# Patient Record
Sex: Male | Born: 1960 | Race: Black or African American | Hispanic: No | Marital: Single | Smoking: Current some day smoker
Health system: Southern US, Community
[De-identification: ages and names within clinical notes are randomized; demographics above are authoritative.]

## PROBLEM LIST (undated history)

## (undated) DIAGNOSIS — I10 Essential (primary) hypertension: Secondary | ICD-10-CM

## (undated) HISTORY — PX: CHOLECYSTECTOMY: SHX55

## (undated) HISTORY — PX: HERNIA REPAIR: SHX51

## (undated) HISTORY — DX: Essential (primary) hypertension: I10

## (undated) HISTORY — PX: JOINT REPLACEMENT: SHX530

---

## 2017-04-26 ENCOUNTER — Encounter (HOSPITAL_COMMUNITY): Payer: Self-pay | Admitting: Emergency Medicine

## 2017-04-26 ENCOUNTER — Ambulatory Visit (HOSPITAL_COMMUNITY)
Admission: EM | Admit: 2017-04-26 | Discharge: 2017-04-26 | Disposition: A | Discharge status: Home / Self Care | Payer: Managed Care, Other (non HMO) | Attending: Internal Medicine | Admitting: Internal Medicine

## 2017-04-26 DIAGNOSIS — Z91013 Allergy to seafood: Secondary | ICD-10-CM | POA: Insufficient documentation

## 2017-04-26 DIAGNOSIS — A09 Infectious gastroenteritis and colitis, unspecified: Secondary | ICD-10-CM

## 2017-04-26 DIAGNOSIS — R197 Diarrhea, unspecified: Secondary | ICD-10-CM | POA: Diagnosis present

## 2017-04-26 DIAGNOSIS — R109 Unspecified abdominal pain: Secondary | ICD-10-CM | POA: Diagnosis present

## 2017-04-26 DIAGNOSIS — F17210 Nicotine dependence, cigarettes, uncomplicated: Secondary | ICD-10-CM | POA: Insufficient documentation

## 2017-04-26 MED ORDER — CIPROFLOXACIN HCL 500 MG PO TABS: 500.0000 mg | ORAL_TABLET | Freq: Two times a day (BID) | ORAL | 0 refills | Status: AC

## 2017-04-26 MED ORDER — DIPHENOXYLATE-ATROPINE 2.5-0.025 MG PO TABS: 2.0000 | ORAL_TABLET | Freq: Four times a day (QID) | ORAL | 0 refills | Status: DC | PRN

## 2017-04-26 NOTE — ED Triage Notes (Signed)
[facility] with a complaint of abdominal pain and diarrhea x 2 weeks.

## 2017-04-26 NOTE — ED Provider Notes (Signed)
[facility]    CSN: [id number] Arrival date & time: 04/26/17  1628     History   Chief Complaint Chief Complaint  Patient presents with  . Abdominal Pain    HPI Bobby Browning is a 56 y.o. male.  He started 2 weeks ago with some chills after eating fast food and by the next morning was having watery diarrhea. No nausea/vomiting. Took some Imodium and some Pepto-Bismol, stool consistency firmed up for a while, however he has stopped taking these and now has watery diarrhea occurring approximately hourly again. Crampy left lower quadrant discomfort after eating, improves after having a bowel movement. No fever. Little bit of blood on the toilet paper 1. Like initial stools were dark, but unclear timeline for starting Pepto-Bismol and the relationship to the darkness. Not lightheaded. Does feel tired.   No new skin issues, no new joint pains or achiness. He is not sleeping well. Works in Personnel officer, needs a note for work.   HPI  History reviewed. No pertinent past medical history.   Past Surgical History:  Procedure Laterality Date  . HERNIA REPAIR    . JOINT REPLACEMENT         Home Medications   Takes no meds regularly  Family History History reviewed. No pertinent family history.  Social History Social History  Substance Use Topics  . Smoking status: Current Some Day Smoker    Types: Cigarettes  . Smokeless tobacco: Never Used  . Alcohol use Yes     Allergies   Shellfish allergy   Review of Systems Review of Systems  All other systems reviewed and are negative.    Physical Exam Triage Vital Signs ED Triage Vitals  Enc Vitals Group     BP 04/26/17 1658 (!) 152/94     Pulse Rate 04/26/17 1658 72     Resp 04/26/17 1658 18     Temp 04/26/17 1658 98.6 F (37 C)     Temp Source 04/26/17 1658 Oral     SpO2 04/26/17 1658 97 %     Weight --      Height --      Pain Score 04/26/17 1655 5     Pain Loc --    Updated Vital Signs BP (!)  152/94 (BP Location: Right Arm)   Pulse 72   Temp 98.6 F (37 C) (Oral)   Resp 18   SpO2 97%   Physical Exam  Constitutional: He is oriented to [place], place, and time. No distress.  Alert, nicely groomed  HENT:  Head: Atraumatic.  Eyes:  Conjugate gaze, no eye redness/drainage  Neck: Neck supple.  Cardiovascular: Normal rate and regular rhythm.   Pulmonary/Chest: No respiratory distress. He has no wheezes. He has no rales.  Lungs clear, symmetric breath sounds  Abdominal: Soft. He exhibits no distension. There is no tenderness. There is no rebound and no guarding.  Musculoskeletal: Normal range of motion.  Neurological: He is alert and oriented to [place], place, and time.  Skin: Skin is warm and dry.  No cyanosis  Nursing note and vitals reviewed.    UC Treatments / Results  Labs Stool GI panel obtained during visit; result pending  Procedures Procedures (including critical care time)   Final Clinical Impressions(s) / UC Diagnoses   Final diagnoses:  Acute infective gastroenteritis   Stool test for common causes of persistent diarrhea was done today; the urgent care will contact you if additional treatment is needed.  Note for work.  Prescription for cipro (antibiotic) was sent to the pharmacy, and a prescription for lomotil (for diarrhea) was printed.  Push fluids.  Rest.  Careful hand washing after using the bathroom.  New Prescriptions Discharge Medication List as of 04/26/2017  7:01 PM    START taking these medications   Details  ciprofloxacin (CIPRO) 500 MG tablet Take 1 tablet (500 mg total) by mouth 2 (two) times daily., Starting Sun 04/26/2017, Until Fri 05/01/2017, Normal    diphenoxylate-atropine (LOMOTIL) 2.5-0.025 MG tablet Take 2 tablets by mouth 4 (four) times daily as needed for diarrhea or loose stools., Starting Sun 04/26/2017, Print         Eustace MooreMurray, Eliyah Mcshea W, MD 04/27/17 1012

## 2017-04-26 NOTE — Discharge Instructions (Addendum)
Stool test for common causes of persistent diarrhea was done today; the urgent care will contact you if additional treatment is needed.  Note for work.  Prescription for cipro (antibiotic) was sent to the pharmacy, and a prescription for lomotil (for diarrhea) was printed.  Push fluids.  Rest.  Careful hand washing after using the bathroom.

## 2017-04-27 ENCOUNTER — Telehealth (HOSPITAL_COMMUNITY): Payer: Self-pay | Admitting: Internal Medicine

## 2017-04-27 LAB — GASTROINTESTINAL PANEL BY PCR, STOOL (REPLACES STOOL CULTURE)

## 2017-04-27 MED ORDER — TINIDAZOLE 500 MG PO TABS: 2.0000 g | ORAL_TABLET | Freq: Once | ORAL | 0 refills | Status: AC

## 2017-04-27 NOTE — Telephone Encounter (Addendum)
Please let patient and health department know that stool test was positive for giardia.   Prescription for a dose of tinidazole was sent to the pharmacy of record, [facility] [address] [facility] [address].   Anticipate marked improvement in diarrhea over the next 5-7 days.  Recheck for further evaluation if symptoms are not improving.   Continue careful hand washing after every trip to the bathroom and before handling food.  LM

## 2019-06-07 ENCOUNTER — Other Ambulatory Visit: Payer: Self-pay | Admitting: *Deleted

## 2019-06-07 DIAGNOSIS — Z20822 Contact with and (suspected) exposure to covid-19: Secondary | ICD-10-CM

## 2019-06-12 LAB — NOVEL CORONAVIRUS, NAA: SARS-CoV-2, NAA: NOT DETECTED

## 2020-01-26 ENCOUNTER — Ambulatory Visit: Payer: Managed Care, Other (non HMO) | Attending: Internal Medicine

## 2020-01-26 DIAGNOSIS — Z23 Encounter for immunization: Secondary | ICD-10-CM

## 2020-01-26 NOTE — Progress Notes (Signed)
   Covid-19 Vaccination Clinic  Name:  Devanta Daniel    MRN: [id number] DOB: [birth year]  01/26/2020  Mr. Bos was observed post Covid-19 immunization for 15 minutes without incident. He was provided with Vaccine Information Sheet and instruction to access the V-Safe system.   Mr. Quezada was instructed to call 911 with any severe reactions post vaccine: Marland Kitchen Difficulty breathing  . Swelling of face and throat  . A fast heartbeat  . A bad rash all over body  . Dizziness and weakness   Immunizations Administered    Name Date Dose VIS Date Route   Moderna COVID-19 Vaccine 01/26/2020  5:00 PM 0.5 mL 10/25/2019 Intramuscular   Manufacturer: Moderna   Lot: [id number]   NDC: 27741-287-86

## 2020-02-28 ENCOUNTER — Ambulatory Visit: Payer: Managed Care, Other (non HMO) | Attending: Family

## 2020-02-28 DIAGNOSIS — Z23 Encounter for immunization: Secondary | ICD-10-CM

## 2020-02-28 NOTE — Progress Notes (Signed)
   Covid-19 Vaccination Clinic  Name:  Bobby Browning    MRN: [id number] DOB: 1961-01-01  02/28/2020  Mr. [name] was observed post Covid-19 immunization for 15 minutes without incident. He was provided with Vaccine Information Sheet and instruction to access the V-Safe system.   Mr. [name] was instructed to call 911 with any severe reactions post vaccine: Marland Kitchen Difficulty breathing  . Swelling of face and throat  . A fast heartbeat  . A bad rash all over body  . Dizziness and weakness   Immunizations Administered    Name Date Dose VIS Date Route   Moderna COVID-19 Vaccine 02/28/2020  2:17 PM 0.5 mL 10/25/2019 Intramuscular   Manufacturer: Moderna   Lot: [id number]   NDC: 09311-216-24

## 2020-05-15 ENCOUNTER — Ambulatory Visit (HOSPITAL_COMMUNITY)
Admission: EM | Admit: 2020-05-15 | Discharge: 2020-05-15 | Disposition: A | Discharge status: Home / Self Care | Payer: BC Managed Care – PPO | Attending: Physician Assistant | Admitting: Physician Assistant

## 2020-05-15 ENCOUNTER — Encounter (HOSPITAL_COMMUNITY): Payer: Self-pay

## 2020-05-15 ENCOUNTER — Other Ambulatory Visit: Payer: Self-pay

## 2020-05-15 DIAGNOSIS — T07XXXA Unspecified multiple injuries, initial encounter: Secondary | ICD-10-CM

## 2020-05-15 DIAGNOSIS — I10 Essential (primary) hypertension: Secondary | ICD-10-CM
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
## 2020-05-15 NOTE — Discharge Instructions (Addendum)
Return if any problems.

## 2020-09-20 ENCOUNTER — Ambulatory Visit: Payer: BC Managed Care – PPO | Attending: Internal Medicine

## 2020-09-20 DIAGNOSIS — Z23 Encounter for immunization: Secondary | ICD-10-CM

## 2020-09-20 NOTE — Progress Notes (Signed)
   Covid-19 Vaccination Clinic  Name:  Bobby Browning    MRN: [id number] DOB: Oct 28, 1961  09/20/2020  [name] was observed post Covid-19 immunization for 30 minutes based on pre-vaccination screening without incident. He was provided with Vaccine Information Sheet and instruction to access the V-Safe system.   [name] was instructed to call 911 with any severe reactions post vaccine: Marland Kitchen Difficulty breathing  . Swelling of face and throat  . A fast heartbeat  . A bad rash all over body  . Dizziness and weakness

## 2021-01-11 ENCOUNTER — Ambulatory Visit (INDEPENDENT_AMBULATORY_CARE_PROVIDER_SITE_OTHER): Payer: BC Managed Care – PPO

## 2021-01-11 ENCOUNTER — Encounter (HOSPITAL_COMMUNITY): Payer: Self-pay

## 2021-01-11 ENCOUNTER — Other Ambulatory Visit: Payer: Self-pay

## 2021-01-11 ENCOUNTER — Ambulatory Visit (HOSPITAL_COMMUNITY)
Admission: EM | Admit: 2021-01-11 | Discharge: 2021-01-11 | Disposition: A | Discharge status: Home / Self Care | Payer: BC Managed Care – PPO

## 2021-01-11 DIAGNOSIS — R0602 Shortness of breath: Secondary | ICD-10-CM | POA: Diagnosis not present

## 2021-01-11 DIAGNOSIS — J189 Pneumonia, unspecified organism: Secondary | ICD-10-CM | POA: Diagnosis not present

## 2021-01-11 DIAGNOSIS — R059 Cough, unspecified: Secondary | ICD-10-CM | POA: Diagnosis not present

## 2021-01-11 MED ORDER — BENZONATATE 100 MG PO CAPS: 100.0000 mg | ORAL_CAPSULE | Freq: Three times a day (TID) | ORAL | 0 refills | Status: DC | PRN

## 2021-01-11 MED ORDER — AZITHROMYCIN 250 MG PO TABS: ORAL_TABLET | ORAL | 0 refills | Status: DC

## 2021-01-11 MED ORDER — ACETAMINOPHEN 325 MG PO TABS
ORAL_TABLET | ORAL | Status: AC
Start: 2021-01-11 — End: ?
  Filled 2021-01-11: qty 2

## 2021-01-11 MED ORDER — AMOXICILLIN 500 MG PO CAPS: 1000.0000 mg | ORAL_CAPSULE | Freq: Three times a day (TID) | ORAL | 0 refills | Status: DC

## 2021-01-11 MED ORDER — ACETAMINOPHEN 325 MG PO TABS
650.0000 mg | ORAL_TABLET | Freq: Once | ORAL | Status: AC
  Administered 2021-01-11: 650 mg via ORAL

## 2021-01-11 MED ORDER — PROMETHAZINE-DM 6.25-15 MG/5ML PO SYRP: 5.0000 mL | ORAL_SOLUTION | Freq: Every evening | ORAL | 0 refills | Status: DC | PRN

## 2021-01-11 NOTE — ED Provider Notes (Signed)
[facility]   MRN: [id number] DOB: [birth year]  Subjective:   Bobby Browning is a 60 y.o. male presenting for 2 week history of persistent productive cough, nasal congestion, wheezing, chest congestion. Has a history of childhood asthma. Tested negative for COVID 19 twice already. He is vaccinated, has had his booster. Smokes very little.   No current facility-administered medications for this encounter.  Current Outpatient Medications:  .  dextromethorphan-guaiFENesin (MUCINEX DM) 30-600 MG 12hr tablet, Take 1 tablet by mouth 2 (two) times daily., Disp: , Rfl:  .  diphenoxylate-atropine (LOMOTIL) 2.5-0.025 MG tablet, Take 2 tablets by mouth 4 (four) times daily as needed for diarrhea or loose stools., Disp: 30 tablet, Rfl: 0   Allergies  Allergen Reactions  . Shellfish Allergy     History reviewed. No pertinent past medical history.   Past Surgical History:  Procedure Laterality Date  . HERNIA REPAIR    . JOINT REPLACEMENT      History reviewed. No pertinent family history.  Social History   Tobacco Use  . Smoking status: Current Some Day Smoker    Types: Cigarettes  . Smokeless tobacco: Never Used  Vaping Use  . Vaping Use: Never used  Substance Use Topics  . Alcohol use: Yes  . Drug use: Yes    Types: Marijuana    ROS   Objective:   Vitals: BP (!) 149/91 (BP Location: Right Arm)   Pulse 74   Temp (!) 100.9 F (38.3 C) (Oral)   Resp 20   SpO2 97%   Physical Exam Constitutional:      General: He is not in acute distress.    Appearance: Normal appearance. He is well-developed. He is not ill-appearing, toxic-appearing or diaphoretic.  HENT:     Head: Normocephalic and atraumatic.     Right Ear: External ear normal.     Left Ear: External ear normal.     Nose: Nose normal.     Mouth/Throat:     Mouth: Mucous membranes are moist.     Pharynx: Oropharynx is clear.  Eyes:     General: No scleral icterus.    Extraocular Movements:  Extraocular movements intact.     Pupils: Pupils are equal, round, and reactive to light.  Cardiovascular:     Rate and Rhythm: Normal rate and regular rhythm.     Heart sounds: Normal heart sounds. No murmur heard. No friction rub. No gallop.   Pulmonary:     Effort: Pulmonary effort is normal. No respiratory distress.     Breath sounds: No stridor. Examination of the right-upper field reveals rales. Examination of the right-middle field reveals rales. Examination of the right-lower field reveals rales. Rales present. No wheezing or rhonchi.  Neurological:     Mental Status: He is alert and oriented to [place], place, and time.  Psychiatric:        Mood and Affect: Mood normal.        Behavior: Behavior normal.        Thought Content: Thought content normal.     DG Chest 2 View  Result Date: 01/11/2021 CLINICAL DATA:  Cough, shortness of breath. EXAM: CHEST - 2 VIEW COMPARISON:  None. FINDINGS: The heart size and mediastinal contours are within normal limits. No pneumothorax or pleural effusion is noted. Left lung is clear. Right lower lobe airspace opacity is noted consistent with pneumonia. The visualized skeletal structures are unremarkable. IMPRESSION: Right lower lobe pneumonia. Followup PA and lateral chest X-ray  is recommended in 3-4 weeks following trial of antibiotic therapy to ensure resolution and exclude underlying malignancy. Electronically Signed   By: Lupita Raider M.D.   On: 01/11/2021 16:38   Assessment and Plan :   PDMP not reviewed this encounter.  1. Community acquired pneumonia of right lower lobe of lung     As per up-to-date, will start azithromycin, amoxicillin.  Use supportive care otherwise.  Recommend recheck and repeat chest x-ray in 3 to 4 weeks. Counseled patient on potential for adverse effects with medications prescribed/recommended today, ER and return-to-clinic precautions discussed, patient verbalized understanding.    Wallis Bamberg, [place] 01/11/21  1707

## 2021-01-11 NOTE — Discharge Instructions (Signed)
We will manage this as a pneumonia with amoxicillin and azithromycin.  You can use the cough suppression medications I prescribed as well.  Please come back to our clinic in 3 to 4 weeks for repeat chest x-ray.  For sore throat or cough try using a honey-based tea. Use 3 teaspoons of honey with juice squeezed from half lemon. Place shaved pieces of ginger into 1/2-1 cup of water and warm over stove top. Then mix the ingredients and repeat every 4 hours as needed. Please take ibuprofen 600mg  every 6 hours with food alternating with OR taken together with Tylenol 500mg -650mg  every 6 hours for throat pain, fevers, aches and pains. Hydrate very well with at least 2 liters of water. Eat light meals such as soups (chicken and noodles, vegetable, chicken and wild rice).  Do not eat foods that you are allergic to.  Taking an antihistamine like Zyrtec, Allegra or Claritin can help against postnasal drainage, sinus congestion.

## 2021-01-11 NOTE — ED Triage Notes (Signed)
Pt parents with cough, nasal congestion, chest congestion and wheezing x 2 weeks. Mucinexgives some relief. Denies chest pain, SOB, fever.

## 2021-01-12 ENCOUNTER — Emergency Department (HOSPITAL_COMMUNITY)
Admission: EM | Admit: 2021-01-12 | Discharge: 2021-01-12 | Disposition: A | Discharge status: Home / Self Care | Payer: BC Managed Care – PPO | Attending: Emergency Medicine | Admitting: Emergency Medicine

## 2021-01-12 ENCOUNTER — Other Ambulatory Visit: Payer: Self-pay

## 2021-01-12 ENCOUNTER — Encounter (HOSPITAL_COMMUNITY): Payer: Self-pay

## 2021-01-12 DIAGNOSIS — Z5321 Procedure and treatment not carried out due to patient leaving prior to being seen by health care provider: Secondary | ICD-10-CM | POA: Insufficient documentation

## 2021-01-12 DIAGNOSIS — J189 Pneumonia, unspecified organism: Secondary | ICD-10-CM | POA: Diagnosis not present

## 2021-01-12 NOTE — ED Triage Notes (Signed)
Patient seen at [facility] yesterday and diagnosed with pneumonia and started on antibiotics yesterday. Here because reports ongoing hemoptysis and only had antibiotics x 1 day
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
## 2021-01-13 ENCOUNTER — Other Ambulatory Visit: Payer: Self-pay

## 2021-01-13 ENCOUNTER — Emergency Department (HOSPITAL_COMMUNITY): Payer: BC Managed Care – PPO

## 2021-01-13 ENCOUNTER — Encounter (HOSPITAL_COMMUNITY): Payer: Self-pay

## 2021-01-13 ENCOUNTER — Emergency Department (HOSPITAL_COMMUNITY)
Admission: EM | Admit: 2021-01-13 | Discharge: 2021-01-13 | Disposition: A | Discharge status: Home / Self Care | Payer: BC Managed Care – PPO | Attending: Emergency Medicine | Admitting: Emergency Medicine

## 2021-01-13 DIAGNOSIS — J181 Lobar pneumonia, unspecified organism: Secondary | ICD-10-CM | POA: Diagnosis not present

## 2021-01-13 DIAGNOSIS — F17210 Nicotine dependence, cigarettes, uncomplicated: Secondary | ICD-10-CM | POA: Diagnosis not present

## 2021-01-13 DIAGNOSIS — Z96698 Presence of other orthopedic joint implants: Secondary | ICD-10-CM | POA: Diagnosis not present

## 2021-01-13 DIAGNOSIS — J189 Pneumonia, unspecified organism: Secondary | ICD-10-CM

## 2021-01-13 DIAGNOSIS — R042 Hemoptysis: Secondary | ICD-10-CM | POA: Insufficient documentation

## 2021-01-13 DIAGNOSIS — R059 Cough, unspecified: Secondary | ICD-10-CM | POA: Diagnosis present

## 2021-01-13 NOTE — Discharge Instructions (Addendum)
Continue taking antibiotics as prescribed. Make sure staying well-hydrated with water. Follow-up for repeat x-ray in 3 to 4 weeks. Return to the emergency room if you develop pain, increase difficulty breathing, if you are coughing up blood clots, with any new, worsening, or concerning symptoms

## 2021-01-13 NOTE — ED Triage Notes (Signed)
Pt presents with c/o hemoptysis. Pt was diagnosed with pneumonia 2 days ago and has been on abx since then. Pt reports that he had been coughing up blood when he was diagnosed but when he saw the [place] papers saying to come back for coughing up blood with a dx of pneumonia, he became concerned that there was more going on. Pt went to [facility] yesterday for same.

## 2021-01-13 NOTE — ED Provider Notes (Signed)
[facility]-EMERGENCY DEPT Provider Note   CSN: [id number] Arrival date & time: 01/13/21  0741     History Chief Complaint  Patient presents with  . Hemoptysis    Bobby Browning is a 60 y.o. male presenting for evaluation of hemoptysis.   Pt states he had 2 wks of cough and sob. He was seen at UC 2 days ago, dx with pna and started on abx. Pt states as soon as he got home, he had hemoptysis, which he describes as blood streaked sputum. He states after 2 days of abx, he is feeling much better, cough and SOB improved.  He denies fevers, chills, chest pain, chest tightness, nausea, vomiting abdominal pain.  He denies recent travel, surgeries, immobilization, history of cancer, history previous DVT/PE, or hormone use.  He is not on blood thinners.  He denies leg pain or swelling.  He is here because the hemoptysis is new, in the AVS he was discharged with said to be reevaluated if he develops hemoptysis. The amt of blood has not changed or worsened.   HPI     History reviewed. No pertinent past medical history.  There are no problems to display for this patient.   Past Surgical History:  Procedure Laterality Date  . HERNIA REPAIR    . JOINT REPLACEMENT         History reviewed. No pertinent family history.  Social History   Tobacco Use  . Smoking status: Current Some Day Smoker    Types: Cigarettes  . Smokeless tobacco: Never Used  Vaping Use  . Vaping Use: Never used  Substance Use Topics  . Alcohol use: Yes  . Drug use: Yes    Types: Marijuana    Home Medications Prior to Admission medications   Medication Sig Start Date End Date Taking? Authorizing Provider  amoxicillin (AMOXIL) 500 MG capsule Take 2 capsules (1,000 mg total) by mouth 3 (three) times daily. 01/11/21  Yes Wallis Bamberg, PA-C  azithromycin (ZITHROMAX) 250 MG tablet Start with 2 tablets today, then 1 daily thereafter. Patient taking differently: Take 250 mg by mouth See admin  instructions. Start with 2 tablets today, then 1 daily thereafter. 01/11/21  Yes Wallis Bamberg, PA-C  benzonatate (TESSALON) 100 MG capsule Take 1-2 capsules (100-200 mg total) by mouth 3 (three) times daily as needed for cough. 01/11/21  Yes Wallis Bamberg, PA-C  dextromethorphan-guaiFENesin [facility] DM) 30-600 MG 12hr tablet Take 1 tablet by mouth 2 (two) times daily as needed for cough.   Yes [provider]  ibuprofen (ADVIL) 200 MG tablet Take 200 mg by mouth every 6 (six) hours as needed for mild pain.   Yes [provider]  promethazine-dextromethorphan (PROMETHAZINE-DM) 6.25-15 MG/5ML syrup Take 5 mLs by mouth at bedtime as needed for cough. 01/11/21  Yes Wallis Bamberg, PA-C  diphenoxylate-atropine (LOMOTIL) 2.5-0.025 MG tablet Take 2 tablets by mouth 4 (four) times daily as needed for diarrhea or loose stools. Patient not taking: No sig reported 04/26/17   Isa Rankin, MD    Allergies    Shellfish allergy  Review of Systems   Review of Systems  Respiratory: Positive for cough.   All other systems reviewed and are negative.   Physical Exam Updated Vital Signs BP (!) 190/113   Pulse (!) 59   Temp 97.8 F (36.6 C) (Oral)   Resp 18   SpO2 96%   Physical Exam Vitals and nursing note reviewed.  Constitutional:      General:  He is not in acute distress.    Appearance: He is well-developed and well-nourished.     Comments: Resting in the bed in no acute distress  HENT:     Head: Normocephalic and atraumatic.  Eyes:     Extraocular Movements: EOM normal.     Conjunctiva/sclera: Conjunctivae normal.     Pupils: Pupils are equal, round, and reactive to light.  Cardiovascular:     Rate and Rhythm: Normal rate and regular rhythm.     Pulses: Normal pulses and intact distal pulses.  Pulmonary:     Effort: Pulmonary effort is normal. No respiratory distress.     Breath sounds: Normal breath sounds. No wheezing.     Comments: Rales noted in the right middle and  lower lobe.  Speaking full sentences.  Clear lung sounds in all fields.  No cough noted during exam.  Sats stable on room air. Abdominal:     General: There is no distension.     Palpations: Abdomen is soft. There is no mass.     Tenderness: There is no abdominal tenderness. There is no guarding or rebound.  Musculoskeletal:        General: Normal range of motion.     Cervical back: Normal range of motion and neck supple.  Skin:    General: Skin is warm and dry.     Capillary Refill: Capillary refill takes less than 2 seconds.  Neurological:     Mental Status: He is alert and oriented to [place], place, and time.  Psychiatric:        Mood and Affect: Mood and affect normal.     ED Results / Procedures / Treatments   Labs (all labs ordered are listed, but only abnormal results are displayed) Labs Reviewed - No data to display  EKG None  Radiology DG Chest 2 View  Result Date: 01/13/2021 CLINICAL DATA:  Cough and hemoptysis EXAM: CHEST - 2 VIEW COMPARISON:  01/11/2021 FINDINGS: Heart size and mediastinal contours are unremarkable. Airspace disease within the right lower lobe is again noted and is stable from previous exam. No new pulmonary opacities. No pleural effusions or edema. Spondylosis noted within the thoracic spine. IMPRESSION: Stable right lower lobe airspace disease. Electronically Signed   By: Signa Kell M.D.   On: 01/13/2021 08:44   DG Chest 2 View  Result Date: 01/11/2021 CLINICAL DATA:  Cough, shortness of breath. EXAM: CHEST - 2 VIEW COMPARISON:  None. FINDINGS: The heart size and mediastinal contours are within normal limits. No pneumothorax or pleural effusion is noted. Left lung is clear. Right lower lobe airspace opacity is noted consistent with pneumonia. The visualized skeletal structures are unremarkable. IMPRESSION: Right lower lobe pneumonia. Followup PA and lateral chest X-ray is recommended in 3-4 weeks following trial of antibiotic therapy to ensure  resolution and exclude underlying malignancy. Electronically Signed   By: Lupita Raider M.D.   On: 01/11/2021 16:38    Procedures Procedures   Medications Ordered in ED Medications - No data to display  ED Course  I have reviewed the triage vital signs and the nursing notes.  Pertinent labs & imaging results that were available during my care of the patient were reviewed by me and considered in my medical decision making (see chart for details).    MDM Rules/Calculators/A&P                          Patient presenting for evaluation  of hemoptysis in the setting of recent pneumonia diagnosis.  On exam, patient appears nontoxic.  He states since being on antibiotics in 2 days, symptoms have significantly improved.  He is having blood-streaked sputum, but is not having blood clots.  No risk factors for PE.  He has no chest pain, and vital signs are overall reassuring, no tachycardia, hypotension, or hypoxia.  Discussed with patient that his blood-streaked sputum is likely due to the pneumonia.  Offered evaluation for PE versus alveolar hemorrhage versus cancer vs other atypical findings in the lungs with a CT scan.  However as patient states he is feeling better, he does not want a CT scan.  We will repeat x-ray to ensure no significant change from previous, I do not expect the pneumonia to be resolved.  X-ray viewed interpreted by me, consistent with right lower lobe pneumonia.  Discussed findings with patient.  He remains well-appearing in the ED.  I discussed continued close monitoring, return to the ER with worsening symptoms.  Discussed importance of continuing antibiotics.  At this time, patient appears safe for discharge.  Return precautions given.  Patient states he understands and agrees to plan.   Final Clinical Impression(s) / ED Diagnoses Final diagnoses:  Community acquired pneumonia of right lower lobe of lung  Hemoptysis    Rx / DC Orders ED Discharge Orders    None        Alveria Apley, PA-C 01/13/21 [id number]    Tegeler, Canary Brim, MD 01/13/21 [phone]

## 2022-02-28 IMAGING — DX DG CHEST 2V
2 series · 2 of 2 positions shown · non-contrast
Comparison: None.

CLINICAL DATA: Cough, shortness of breath.

EXAM:
CHEST - 2 VIEW

[chest pa]
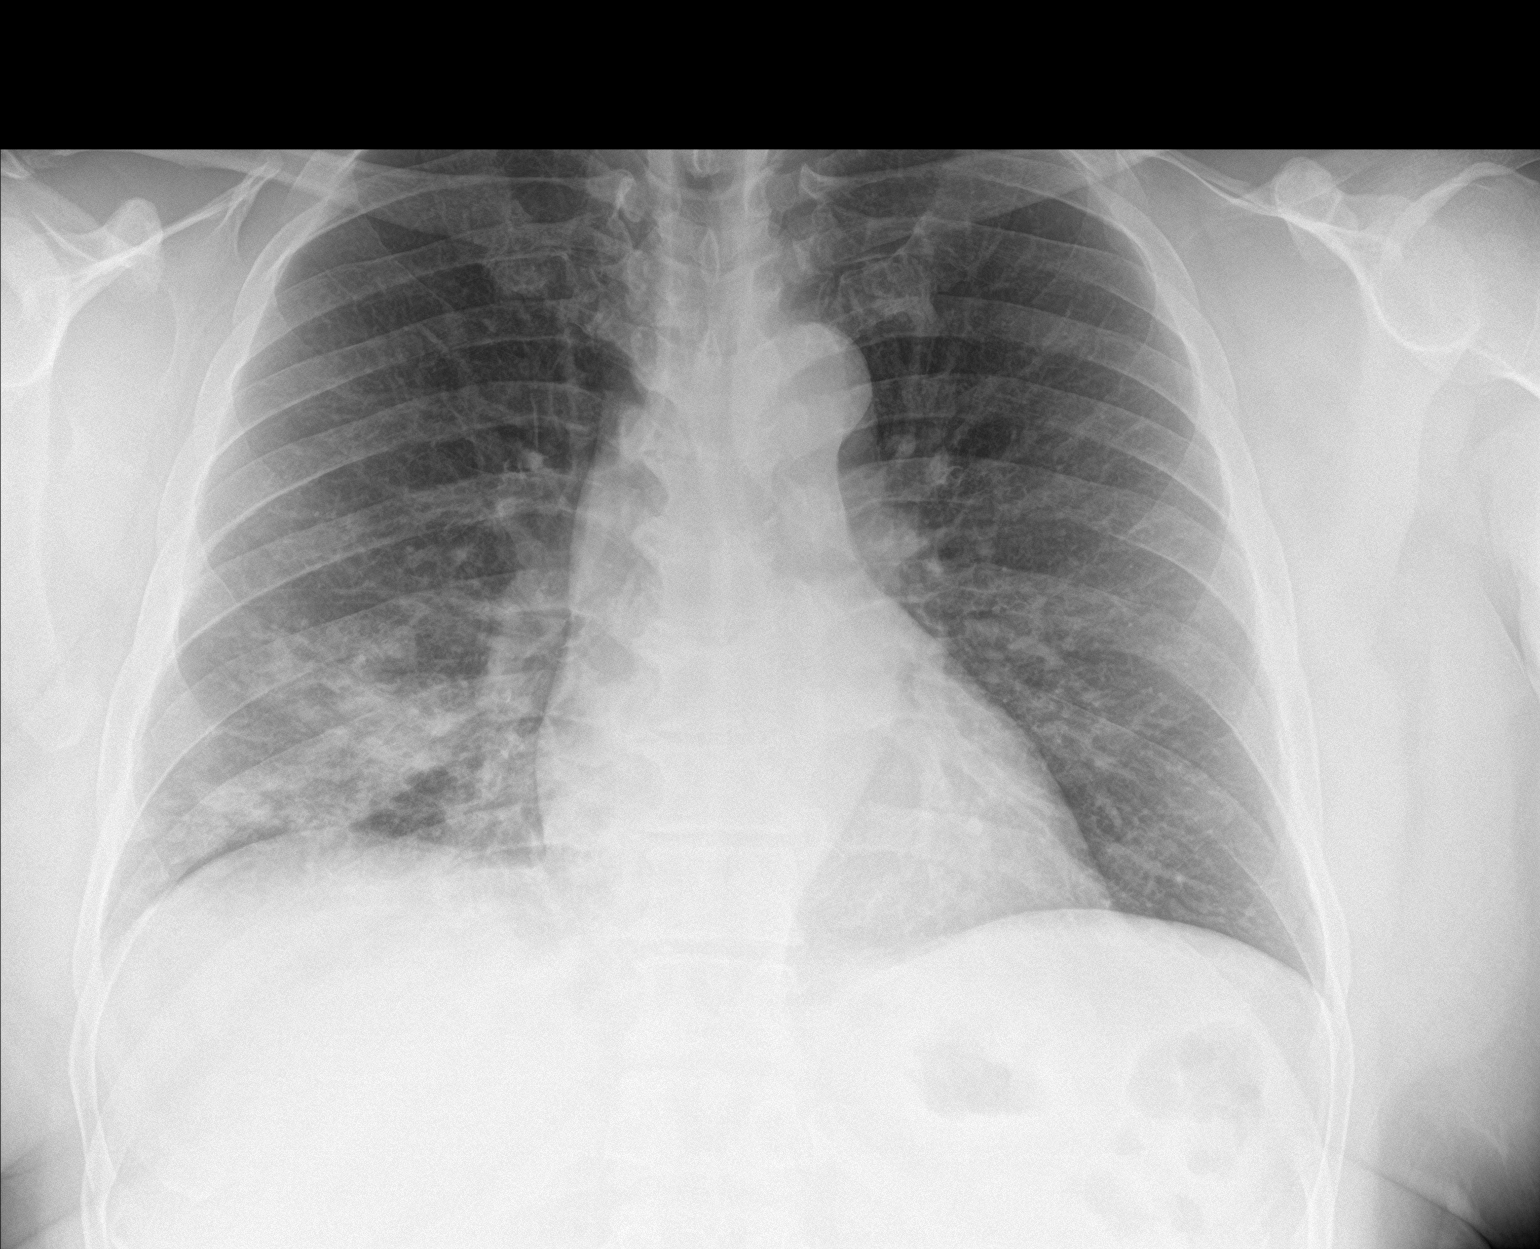

[chest lat]
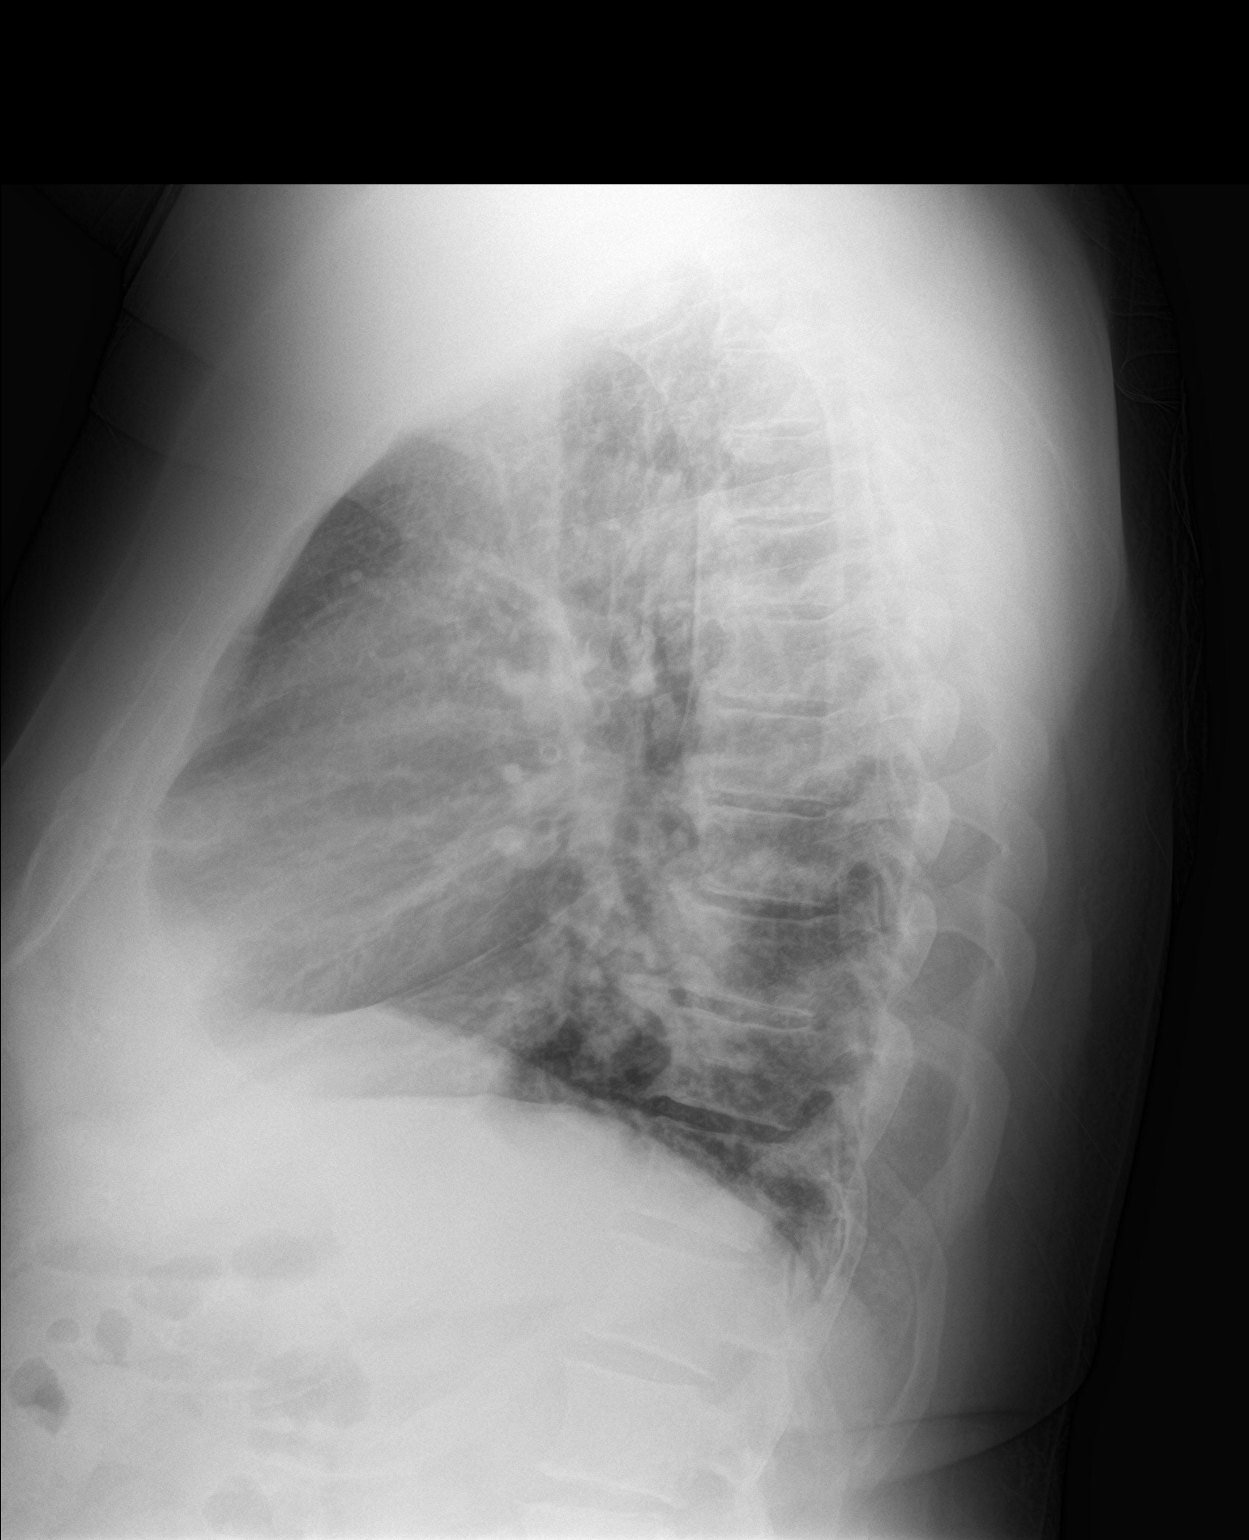

[2 of 2 positions shown; findings below may reference images not displayed]

FINDINGS: The heart size and mediastinal contours are within normal limits. No
pneumothorax or pleural effusion is noted. Left lung is clear. Right
lower lobe airspace opacity is noted consistent with pneumonia. The
visualized skeletal structures are unremarkable.
IMPRESSION: Right lower lobe pneumonia. Followup PA and lateral chest X-ray is
recommended in 3-4 weeks following trial of antibiotic therapy to
ensure resolution and exclude underlying malignancy.

## 2022-03-02 IMAGING — CR DG CHEST 2V
2 series · 2 of 2 positions shown · non-contrast
Comparison: 01/11/2021

CLINICAL DATA: Cough and hemoptysis

EXAM:
CHEST - 2 VIEW

[w chest pa]
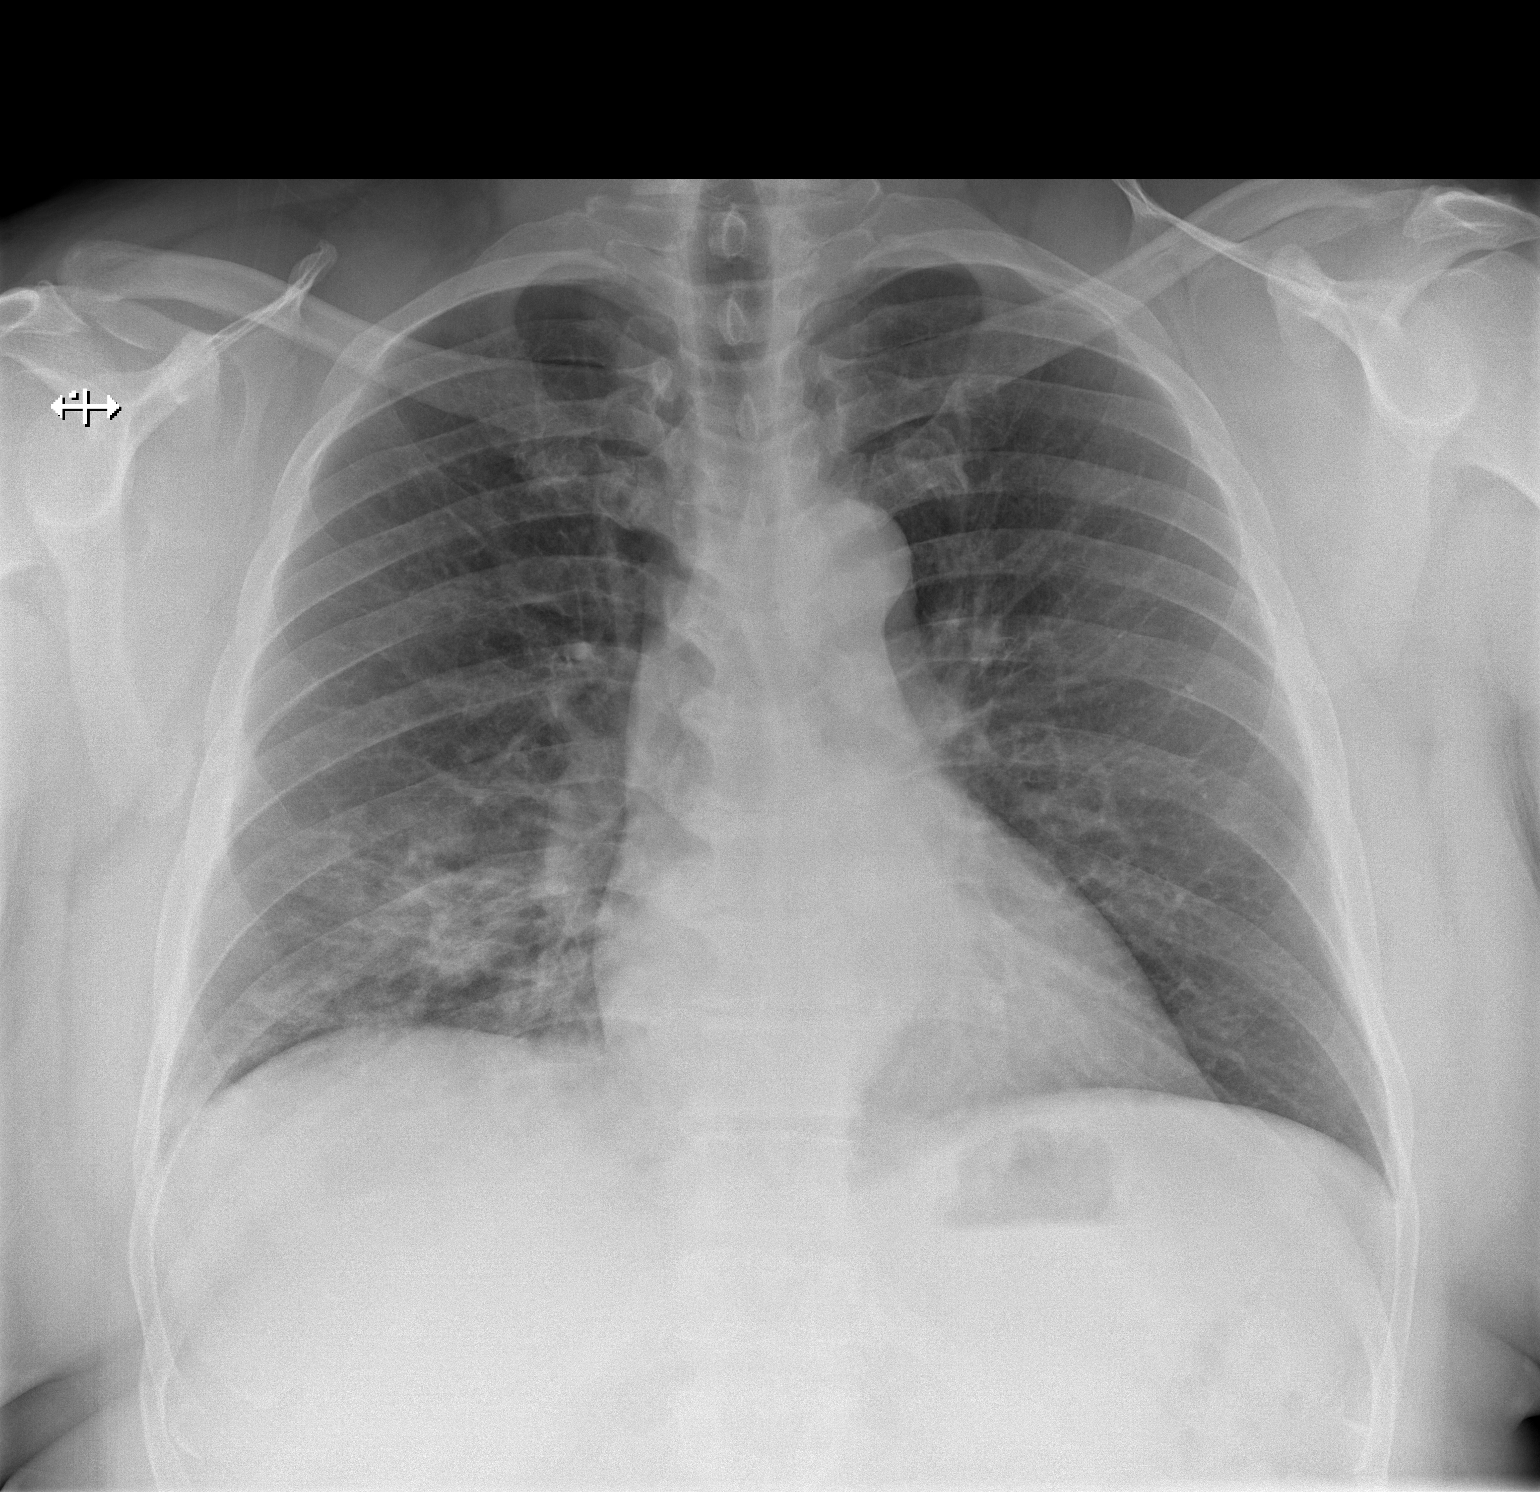

[w chest lat]
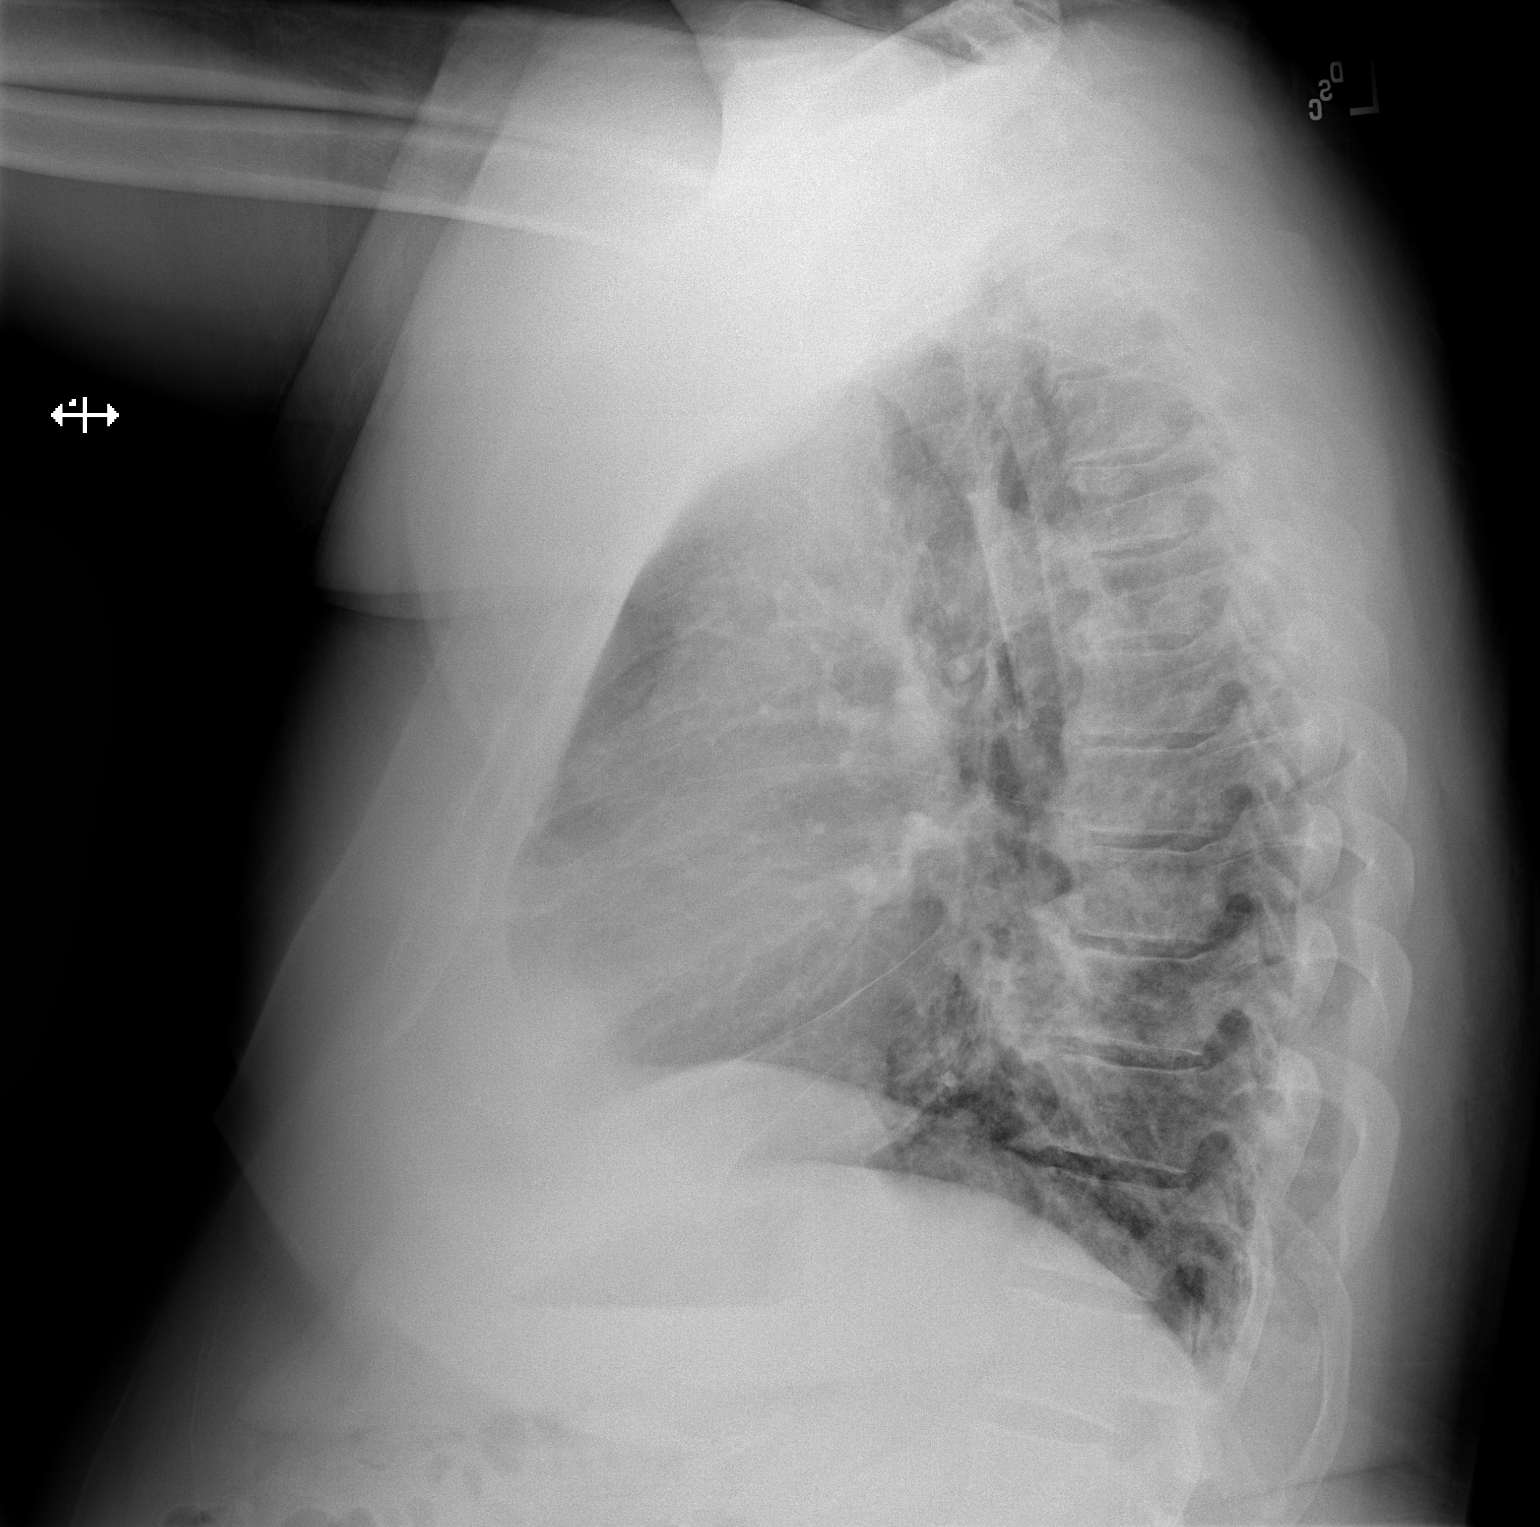

[2 of 2 positions shown; findings below may reference images not displayed]

FINDINGS: Heart size and mediastinal contours are unremarkable. Airspace
disease within the right lower lobe is again noted and is stable
from previous exam. No new pulmonary opacities. No pleural effusions
or edema. Spondylosis noted within the thoracic spine.
IMPRESSION: Stable right lower lobe airspace disease.

## 2023-11-13 ENCOUNTER — Encounter: Payer: Self-pay | Admitting: Internal Medicine

## 2023-12-15 ENCOUNTER — Ambulatory Visit (AMBULATORY_SURGERY_CENTER): Payer: No Typology Code available for payment source | Admitting: *Deleted

## 2023-12-15 VITALS — Ht 70.0 in | Wt 265.0 lb

## 2023-12-15 DIAGNOSIS — Z1211 Encounter for screening for malignant neoplasm of colon: Secondary | ICD-10-CM

## 2023-12-15 MED ORDER — SUFLAVE 178.7 G PO SOLR: 1.0000 | ORAL | 0 refills | Status: DC

## 2023-12-15 NOTE — Progress Notes (Signed)
Pt's name and DOB verified at the beginning of the pre-visit wit 2 identifiers  Pt denies any difficulty with ambulating,sitting, laying down or rolling side to side  Pt has no issues with ambulation   Pt has no issues moving head neck or swallowing  No egg or soy allergy known to patient   No issues known to pt with past sedation with any surgeries or procedures  Pt denies having issues being intubated  No FH of Malignant Hyperthermia  Pt is not on diet pills or shots  Pt is not on home 02   Pt is not on blood thinners   Pt denies issues with constipation   Pt is not on dialysis  Pt denise any abnormal heart rhythms   Pt denies any upcoming cardiac testing  Pt encouraged to use to use Singlecare or Goodrx to reduce cost   Patient's chart reviewed by Cathlyn Parsons CNRA prior to pre-visit and patient appropriate for the [facility].  Pre-visit completed and red dot placed by patient's name on their procedure day (on provider's schedule).  .  Visit by phone  Pt states weight is 267 lb  Instructed pt why it is important to and  to call if they have any changes in health or new medications. Directed them to the # given and on instructions.     Instructions reviewed. Pt given both [facility] main # and MD on call # prior to instructions.  Pt states understanding. Instructed to review again prior to procedure. Pt states they will.   Informed pt that they will receive a call from [facility] regarding there prep med.

## 2024-01-13 ENCOUNTER — Encounter: Payer: Self-pay | Admitting: Internal Medicine

## 2024-01-15 ENCOUNTER — Ambulatory Visit: Payer: No Typology Code available for payment source | Admitting: Internal Medicine

## 2024-01-15 ENCOUNTER — Encounter: Payer: Self-pay | Admitting: Internal Medicine

## 2024-01-15 VITALS — BP 135/87 | HR 70 | Temp 97.9°F | Resp 18 | Ht 70.0 in | Wt 265.0 lb

## 2024-01-15 DIAGNOSIS — D122 Benign neoplasm of ascending colon: Secondary | ICD-10-CM

## 2024-01-15 DIAGNOSIS — K5730 Diverticulosis of large intestine without perforation or abscess without bleeding: Secondary | ICD-10-CM

## 2024-01-15 DIAGNOSIS — Z1211 Encounter for screening for malignant neoplasm of colon: Secondary | ICD-10-CM

## 2024-01-15 DIAGNOSIS — K635 Polyp of colon: Secondary | ICD-10-CM | POA: Diagnosis not present

## 2024-01-15 DIAGNOSIS — D125 Benign neoplasm of sigmoid colon: Secondary | ICD-10-CM

## 2024-01-15 MED ORDER — SODIUM CHLORIDE 0.9 % IV SOLN: 500.0000 mL | INTRAVENOUS | Status: DC

## 2024-01-15 NOTE — Progress Notes (Signed)
HISTORY OF PRESENT ILLNESS:  Bobby Browning is a 63 y.o. male sent for screening colonoscopy.  REVIEW OF SYSTEMS:  All non-GI ROS negative except for  Past Medical History:  Diagnosis Date   Hypertension     Past Surgical History:  Procedure Laterality Date   CHOLECYSTECTOMY     HERNIA REPAIR     JOINT REPLACEMENT      Social History Jheremy Foxworthy  reports that he has been smoking cigarettes. He has never used smokeless tobacco. He reports current alcohol use. He reports that he does not currently use drugs after having used the following drugs: Marijuana.  family history includes Colon cancer in his father.  Allergies  Allergen Reactions   Shellfish Allergy Hives and Shortness Of Breath       PHYSICAL EXAMINATION: Vital signs: BP 125/74   Pulse 65   Temp 97.9 F (36.6 C) (Temporal)   Ht 5\' 10"  (1.778 m)   Wt 265 lb (120.2 kg)   SpO2 98%   BMI 38.02 kg/m  General: Well-developed, well-nourished, no acute distress HEENT: Sclerae are anicteric, conjunctiva pink. Oral mucosa intact Lungs: Clear Heart: Regular Abdomen: soft, nontender, nondistended, no obvious ascites, no peritoneal signs, normal bowel sounds. No organomegaly. Extremities: No edema Psychiatric: alert and oriented x3. Cooperative     ASSESSMENT:  Colon cancer screening   PLAN:  Screening colonoscopy

## 2024-01-15 NOTE — Progress Notes (Signed)
 Pt's states no medical or surgical changes since previsit or office visit.

## 2024-01-15 NOTE — Patient Instructions (Signed)

## 2024-01-15 NOTE — Progress Notes (Signed)
 Called to room to assist during endoscopic procedure.  Patient ID and intended procedure confirmed with present staff. Received instructions for my participation in the procedure from the performing physician.

## 2024-01-15 NOTE — Progress Notes (Signed)
 Report to PACU, RN, vss, BBS= Clear.

## 2024-01-15 NOTE — Op Note (Signed)
[facility] Patient Name: Bobby Browning Procedure Date: 01/15/2024 2:56 PM MRN: [id number] Endoscopist: Wilhemina Bonito. Marina Goodell , MD, [id number] Age: 63 Referring MD:  Date of Birth: February 11, 1961 Gender: Male Account #: [id number] Procedure:                Colonoscopy with cold snare polypectomy x 1; with                            biopsy polypectomy x 1 Indications:              Screening for colorectal malignant neoplasm.                            Reports previous exam in [place]. No details Medicines:                Monitored Anesthesia Care Procedure:                Pre-Anesthesia Assessment:                           - Prior to the procedure, a History and Physical                            was performed, and patient medications and                            allergies were reviewed. The patient's tolerance of                            previous anesthesia was also reviewed. The risks                            and benefits of the procedure and the sedation                            options and risks were discussed with the patient.                            All questions were answered, and informed consent                            was obtained. Prior Anticoagulants: The patient has                            taken no anticoagulant or antiplatelet agents. ASA                            Grade Assessment: II - A patient with mild systemic                            disease. After reviewing the risks and benefits,                            the patient was deemed in satisfactory condition to  undergo the procedure.                           After obtaining informed consent, the colonoscope                            was passed under direct vision. Throughout the                            procedure, the patient's blood pressure, pulse, and                            oxygen saturations were monitored continuously. The                            Olympus  Scope SN: [id number] was introduced through                            the anus and advanced to the the cecum, identified                            by appendiceal orifice and ileocecal valve. The                            ileocecal valve, appendiceal orifice, and rectum                            were photographed. The quality of the bowel                            preparation was excellent. The colonoscopy was                            performed without difficulty. The patient tolerated                            the procedure well. The bowel preparation used was                            SUPREP via split dose instruction. Scope In: 3:05:19 PM Scope Out: 3:18:44 PM Scope Withdrawal Time: 0 hours 11 minutes 40 seconds  Total Procedure Duration: 0 hours 13 minutes 25 seconds  Findings:                 A 4 mm polyp was found in the ascending colon. The                            polyp was sessile. The polyp was removed with a                            cold snare. Resection and retrieval were complete.                           A 1 mm polyp was found in  the sigmoid colon. The                            polyp was removed with a jumbo cold forceps.                            Resection and retrieval were complete.                           Multiple diverticula were found in the left colon                            and right colon.                           The exam was otherwise without abnormality on                            direct and retroflexion views. Complications:            No immediate complications. Estimated blood loss:                            None. Estimated Blood Loss:     Estimated blood loss: none. Impression:               - One 4 mm polyp in the ascending colon, removed                            with a cold snare. Resected and retrieved.                           - One 1 mm polyp in the sigmoid colon, removed with                            a jumbo cold forceps.  Resected and retrieved.                           - Diverticulosis in the left colon and in the right                            colon.                           - The examination was otherwise normal on direct                            and retroflexion views. Recommendation:           - Repeat colonoscopy in 7-10 years for surveillance.                           - Patient has a contact number available for                            emergencies. The signs and symptoms  of potential                            delayed complications were discussed with the                            patient. Return to normal activities tomorrow.                            Written discharge instructions were provided to the                            patient.                           - Resume previous diet.                           - Continue present medications.                           - Await pathology results. Wilhemina Bonito. Marina Goodell, MD 01/15/2024 3:24:03 PM This report has been signed electronically.

## 2024-01-18 ENCOUNTER — Telehealth: Payer: Self-pay

## 2024-01-18 NOTE — Telephone Encounter (Signed)
 Left message on answering machine.

## 2024-01-20 ENCOUNTER — Encounter: Payer: Self-pay | Admitting: Internal Medicine

## 2024-01-20 LAB — SURGICAL PATHOLOGY

## 2024-12-29 ENCOUNTER — Ambulatory Visit
Admission: EM | Admit: 2024-12-29 | Discharge: 2024-12-29 | Disposition: A | Discharge status: Home / Self Care | Payer: Self-pay | Source: Home / Self Care

## 2024-12-29 ENCOUNTER — Encounter: Payer: Self-pay | Admitting: Emergency Medicine

## 2024-12-29 DIAGNOSIS — R0981 Nasal congestion: Secondary | ICD-10-CM

## 2024-12-29 DIAGNOSIS — R051 Acute cough: Secondary | ICD-10-CM

## 2024-12-29 MED ORDER — ALBUTEROL SULFATE HFA 108 (90 BASE) MCG/ACT IN AERS: 1.0000 | INHALATION_SPRAY | Freq: Four times a day (QID) | RESPIRATORY_TRACT | 0 refills | Status: AC | PRN

## 2024-12-29 MED ORDER — PREDNISONE 10 MG (21) PO TBPK: ORAL_TABLET | Freq: Every day | ORAL | 0 refills | Status: AC

## 2024-12-29 NOTE — Discharge Instructions (Signed)
 Use inhaler as needed for shortness of breath symptoms become worse in the next 48 hours or he begins to run a fever greater than 101.1 he will need to return to have a chest x-ray to rule out pneumonia Stay hydrated drink plenty of fluids Use a humidifier at nighttime as needed Take over-the-counter cough medicine as needed

## 2024-12-29 NOTE — ED Triage Notes (Signed)
 Pt c/o cough, chest congestion and wheezing x 2 weeks. Pt has not taken any medication for his symptoms.

## 2024-12-29 NOTE — ED Provider Notes (Signed)
 " [facility]    CSN: [id number] Arrival date & time: 12/29/24  0820      History   Chief Complaint Chief Complaint  Patient presents with   chest congestion    Cough   Wheezing    HPI Jaekwon Mcclune is a 64 y.o. male.   Patient presents today with cough x 2 weeks after having flulike symptoms.  Denies any shortness of breath no chest pain no nausea vomiting or diarrhea no fevers.  Has not taken anything prior to arrival does have a history of childhood asthma.    Past Medical History:  Diagnosis Date   Hypertension     There are no active problems to display for this patient.   Past Surgical History:  Procedure Laterality Date   CHOLECYSTECTOMY     HERNIA REPAIR     JOINT REPLACEMENT         Home Medications    Prior to Admission medications  Medication Sig Start Date End Date Taking? Authorizing Provider  amLODipine (NORVASC) 10 MG tablet Take 1 tablet every day by oral route for 90 days. 12/01/23   [provider]  gabapentin (NEURONTIN) 300 MG capsule Take 300 mg by mouth 3 (three) times daily.    [provider]  ibuprofen (ADVIL) 200 MG tablet Take 200 mg by mouth every 6 (six) hours as needed for mild pain. Patient not taking: Reported on 12/15/2023    [provider]    Family History Family History  Problem Relation Age of Onset   Colon cancer Father    Colon polyps Neg Hx    Esophageal cancer Neg Hx    Rectal cancer Neg Hx    Stomach cancer Neg Hx     Social History Social History[1]   Allergies   Shellfish allergy   Review of Systems Review of Systems  Constitutional:  Negative for activity change, chills and fever.  HENT:  Positive for congestion and postnasal drip.   Eyes: Negative.   Respiratory:  Positive for cough and wheezing. Negative for shortness of breath.   Cardiovascular: Negative.   Gastrointestinal: Negative.   Genitourinary: Negative.   Musculoskeletal: Negative.    Neurological: Negative.      Physical Exam Triage Vital Signs ED Triage Vitals  Encounter Vitals Group     BP 12/29/24 0847 133/83     Girls Systolic BP Percentile --      Girls Diastolic BP Percentile --      Boys Systolic BP Percentile --      Boys Diastolic BP Percentile --      Pulse Rate 12/29/24 0847 65     Resp 12/29/24 0847 16     Temp 12/29/24 0847 97.9 F (36.6 C)     Temp Source 12/29/24 0847 Oral     SpO2 12/29/24 0847 95 %     Weight 12/29/24 0846 284 lb (128.8 kg)     Height --      Head Circumference --      Peak Flow --      Pain Score 12/29/24 0846 0     Pain Loc --      Pain Education --      Exclude from Growth Chart --    No data found.  Updated Vital Signs BP 133/83 (BP Location: Left Arm)   Pulse 65   Temp 97.9 F (36.6 C) (Oral)   Resp 16   Wt 284 lb (128.8 kg)   SpO2  95%   BMI 40.75 kg/m   Visual Acuity Right Eye Distance:   Left Eye Distance:   Bilateral Distance:    Right Eye Near:   Left Eye Near:    Bilateral Near:     Physical Exam Constitutional:      Appearance: Normal appearance.  HENT:     Right Ear: Tympanic membrane normal.     Left Ear: Tympanic membrane normal.     Nose: Congestion present.     Mouth/Throat:     Mouth: Mucous membranes are moist.     Pharynx: Oropharynx is clear. No oropharyngeal exudate or posterior oropharyngeal erythema.  Eyes:     Pupils: Pupils are equal, round, and reactive to light.  Cardiovascular:     Rate and Rhythm: Normal rate.  Pulmonary:     Effort: Pulmonary effort is normal.     Breath sounds: Wheezing present.  Abdominal:     General: Abdomen is flat. Bowel sounds are normal.  Musculoskeletal:        General: Normal range of motion.     Cervical back: Normal range of motion.  Neurological:     General: No focal deficit present.     Mental Status: He is alert.      UC Treatments / Results  Labs (all labs ordered are listed, but only abnormal results are  displayed) Labs Reviewed - No data to display  EKG   Radiology No results found.  Procedures Procedures (including critical care time)  Medications Ordered in UC Medications - No data to display  Initial Impression / Assessment and Plan / UC Course  I have reviewed the triage vital signs and the nursing notes.  Pertinent labs & imaging results that were available during my care of the patient were reviewed by me and considered in my medical decision making (see chart for details).     Use inhaler as needed for shortness of breath Discussed with patient if symptoms become worse in the next 48 hours or he begins to run a fever greater than 101.1 he will need to return to have a chest x-ray to rule out pneumonia Stay hydrated drink plenty of fluids Use a humidifier at nighttime as needed Final Clinical Impressions(s) / UC Diagnoses   Final diagnoses:  Acute cough  Nasal congestion     Discharge Instructions      Use inhaler as needed for shortness of breath symptoms become worse in the next 48 hours or he begins to run a fever greater than 101.1 he will need to return to have a chest x-ray to rule out pneumonia Stay hydrated drink plenty of fluids Use a humidifier at nighttime as needed Take over-the-counter cough medicine as needed     ED Prescriptions   None    PDMP not reviewed this encounter.    [1]  Social History Tobacco Use   Smoking status: Former    Types: Cigarettes   Smokeless tobacco: Never  Vaping Use   Vaping status: Never Used  Substance Use Topics   Alcohol use: Yes   Drug use: Not Currently    Types: Marijuana     Merilee Andrea CROME, NP 12/29/24 [phone]  "
